# Patient Record
Sex: Male | Born: 1985 | Race: Black or African American | Hispanic: No | Marital: Married | Smoking: Never smoker
Health system: Southern US, Community
[De-identification: ages and names within clinical notes are randomized; demographics above are authoritative.]

## PROBLEM LIST (undated history)

## (undated) HISTORY — PX: WISDOM TOOTH EXTRACTION: SHX21

---

## 2003-08-02 ENCOUNTER — Emergency Department (HOSPITAL_COMMUNITY): Admission: EM | Admit: 2003-08-02 | Discharge: 2003-08-02 | Payer: Self-pay | Admitting: Emergency Medicine

## 2006-07-24 ENCOUNTER — Emergency Department (HOSPITAL_COMMUNITY): Admission: EM | Admit: 2006-07-24 | Discharge: 2006-07-24 | Payer: Self-pay | Admitting: Family Medicine

## 2006-11-20 ENCOUNTER — Emergency Department (HOSPITAL_COMMUNITY): Admission: EM | Admit: 2006-11-20 | Discharge: 2006-11-20 | Payer: Self-pay | Admitting: Emergency Medicine

## 2007-06-17 ENCOUNTER — Emergency Department (HOSPITAL_COMMUNITY): Admission: EM | Admit: 2007-06-17 | Discharge: 2007-06-17 | Payer: Self-pay | Admitting: Emergency Medicine

## 2011-12-24 ENCOUNTER — Emergency Department (HOSPITAL_BASED_OUTPATIENT_CLINIC_OR_DEPARTMENT_OTHER)
Admission: EM | Admit: 2011-12-24 | Discharge: 2011-12-24 | Disposition: A | Discharge status: Home / Self Care | Payer: Managed Care, Other (non HMO) | Attending: Emergency Medicine | Admitting: Emergency Medicine

## 2011-12-24 ENCOUNTER — Encounter (HOSPITAL_BASED_OUTPATIENT_CLINIC_OR_DEPARTMENT_OTHER): Payer: Self-pay | Admitting: *Deleted

## 2011-12-24 DIAGNOSIS — R1013 Epigastric pain: Secondary | ICD-10-CM | POA: Insufficient documentation

## 2011-12-24 MED ORDER — ONDANSETRON 8 MG PO TBDP
8.0000 mg | ORAL_TABLET | Freq: Once | ORAL | Status: AC
  Administered 2011-12-24: 8 mg via ORAL
  Filled 2011-12-24: qty 1

## 2011-12-24 MED ORDER — GI COCKTAIL ~~LOC~~
30.0000 mL | Freq: Once | ORAL | Status: AC
  Administered 2011-12-24: 30 mL via ORAL
  Filled 2011-12-24: qty 30

## 2011-12-24 MED ORDER — FAMOTIDINE 20 MG PO TABS
20.0000 mg | ORAL_TABLET | Freq: Once | ORAL | Status: AC
  Administered 2011-12-24: 20 mg via ORAL
  Filled 2011-12-24: qty 1

## 2011-12-24 NOTE — Discharge Instructions (Signed)
Take pepcid or zantac as need. You may also try maalox/myalanta as need for symptom relief. Follow up with primary care doctor, or here, in 1 days time for recheck if symptoms fail to improve/resolve. Return to ER right away if worse, severe pain, persistent vomiting, fevers, other concern.      Gastritis Gastritis is an inflammation (the body's way of reacting to injury and/or infection) of the stomach. It is often caused by viral or bacterial (germ) infections. It can also be caused by chemicals (including alcohol) and medications. This illness may be associated with generalized malaise (feeling tired, not well), cramps, and fever. The illness may last 2 to 7 days. If symptoms of gastritis continue, gastroscopy (looking into the stomach with a telescope-like instrument), biopsy (taking tissue samples), and/or blood tests may be necessary to determine the cause. Antibiotics will not affect the illness unless there is a bacterial infection present. One common bacterial cause of gastritis is an organism known as H. Pylori. This can be treated with antibiotics. Other forms of gastritis are caused by too much acid in the stomach. They can be treated with medications such as H2 blockers and antacids. Home treatment is usually all that is needed. Young children will quickly become dehydrated (loss of body fluids) if vomiting and diarrhea are both present. Medications may be given to control nausea. Medications are usually not given for diarrhea unless especially bothersome. Some medications slow the removal of the virus from the gastrointestinal tract. This slows down the healing process. HOME CARE INSTRUCTIONS Home care instructions for nausea and vomiting:  For adults: drink small amounts of fluids often. Drink at least 2 quarts a day. Take sips frequently. Do not drink large amounts of fluid at one time. This may worsen the nausea.   Only take over-the-counter or prescription medicines for pain,  discomfort, or fever as directed by your caregiver.   Drink clear liquids only. Those are anything you can see through such as water, broth, or soft drinks.   Once you are keeping clear liquids down, you may start full liquids, soups, juices, and ice cream or sherbet. Slowly add bland (plain, not spicy) foods to your diet.  Home care instructions for diarrhea:  Diarrhea can be caused by bacterial infections or a virus. Your condition should improve with time, rest, fluids, and/or anti-diarrheal medication.   Until your diarrhea is under control, you should drink clear liquids often in small amounts. Clear liquids include: water, broth, jell-o water and weak tea.  Avoid:  Milk.   Fruits.   Tobacco.   Alcohol.   Extremely hot or cold fluids.   Too much intake of anything at one time.  When your diarrhea stops you may add the following foods, which help the stool to become more formed:  Rice.   Bananas.   Apples without skin.   Dry toast.  Once these foods are tolerated you may add low-fat yogurt and low-fat cottage cheese. They will help to restore the normal bacterial balance in your bowel. Wash your hands well to avoid spreading bacteria (germ) or virus. SEEK IMMEDIATE MEDICAL CARE IF:   You are unable to keep fluids down.   Vomiting or diarrhea become persistent (constant).   Abdominal pain develops, increases, or localizes. (Right sided pain can be appendicitis. Left sided pain in adults can be diverticulitis.)   You develop a fever (an oral temperature above 102 F (38.9 C)).   Diarrhea becomes excessive or contains blood or mucus.  You have excessive weakness, dizziness, fainting or extreme thirst.   You are not improving or you are getting worse.   You have any other questions or concerns.  Document Released: 09/19/2001 Document Revised: 09/14/2011 Document Reviewed: 09/25/2005 [facility] Patient Information 2012 [place], [place].     Abdominal  Pain Abdominal pain can be caused by many things. Your caregiver decides the seriousness of your pain by an examination and possibly blood tests and X-rays. Many cases can be observed and treated at home. Most abdominal pain is not caused by a disease and will probably improve without treatment. However, in many cases, more time must pass before a clear cause of the pain can be found. Before that point, it may not be known if you need more testing, or if hospitalization or surgery is needed. HOME CARE INSTRUCTIONS   Do not take laxatives unless directed by your caregiver.   Take pain medicine only as directed by your caregiver.   Only take over-the-counter or prescription medicines for pain, discomfort, or fever as directed by your caregiver.   Try a clear liquid diet (broth, tea, or water) for as long as directed by your caregiver. Slowly move to a bland diet as tolerated.  SEEK IMMEDIATE MEDICAL CARE IF:   The pain does not go away.   You have a fever.   You keep throwing up (vomiting).   The pain is felt only in portions of the abdomen. Pain in the right side could possibly be appendicitis. In an adult, pain in the left lower portion of the abdomen could be colitis or diverticulitis.   You pass bloody or black tarry stools.  MAKE SURE YOU:   Understand these instructions.   Will watch your condition.   Will get help right away if you are not doing well or get worse.  Document Released: 07/05/2005 Document Revised: 09/14/2011 Document Reviewed: 05/13/2008 [facility] Patient Information 2012 [place], [place].

## 2011-12-24 NOTE — ED Notes (Addendum)
C/o upper abd pain "burning" since this morning- denies n/v- reports he drank etoh last night "a lot"

## 2011-12-24 NOTE — ED Provider Notes (Signed)
History     CSN: [id number]  Arrival date & time 12/24/11  1901   First MD Initiated Contact with Patient 12/24/11 2033      Chief Complaint  Patient presents with  . Abdominal Pain    (Consider location/radiation/quality/duration/timing/severity/associated sxs/prior treatment) Patient is a 26 y.o. male presenting with abdominal pain. The history is provided by the patient.  Abdominal Pain The primary symptoms of the illness include abdominal pain. The primary symptoms of the illness do not include fever, shortness of breath, vomiting or diarrhea.  Symptoms associated with the illness do not include constipation or back pain.  pt states last pm had a few drinks. This morning had burning sensation/mild pain to epigastric area. Non radiating, constant. Slowly better throughout day. Normal appetite. No nvd. No constipation. No fever or chills. No hx pud, pancreatitis or gallstones. No prior abd surgery. No cp or sob. No back or flank pain.   History reviewed. No pertinent past medical history.  History reviewed. No pertinent past surgical history.  No family history on file.  History  Substance Use Topics  . Smoking status: Never Smoker   . Smokeless tobacco: Never Used  . Alcohol Use: Yes     occasional      Review of Systems  Constitutional: Negative for fever.  HENT: Negative for neck pain.   Respiratory: Negative for cough and shortness of breath.   Cardiovascular: Negative for chest pain.  Gastrointestinal: Positive for abdominal pain. Negative for vomiting, diarrhea and constipation.  Genitourinary: Negative for flank pain.  Musculoskeletal: Negative for back pain.  Skin: Negative for rash.  Neurological: Negative for headaches.  Hematological: Does not bruise/bleed easily.  Psychiatric/Behavioral: Negative for confusion.    Allergies  Review of patient's allergies indicates no known allergies.  Home Medications  No current outpatient prescriptions on  file.  BP 152/79  Pulse 69  Temp(Src) 98 F (36.7 C) (Oral)  Resp 20  SpO2 100%  Physical Exam  Nursing note and vitals reviewed. Constitutional: He is oriented to person, place, and time. He appears well-developed and well-nourished. No distress.  HENT:  Head: Atraumatic.  Eyes: Pupils are equal, round, and reactive to light.  Neck: Neck supple. No tracheal deviation present.  Cardiovascular: Normal rate, regular rhythm, normal heart sounds and intact distal pulses.  Exam reveals no gallop and no friction rub.   No murmur heard. Pulmonary/Chest: Effort normal and breath sounds normal. No accessory muscle usage. No respiratory distress.  Abdominal: Soft. Bowel sounds are normal. He exhibits no distension and no mass. There is no tenderness. There is no rebound and no guarding.  Musculoskeletal: Normal range of motion. He exhibits no edema and no tenderness.  Neurological: He is alert and oriented to person, place, and time.  Skin: Skin is warm and dry.  Psychiatric: He has a normal mood and affect.    ED Course  Procedures (including critical care time)     MDM  Pepcid, gi cocktail, zofran.   Pt says used bathroom, had normal bms, says no pain. Normal appetite.   Pt feels improved. No pain. abd soft nt.         Suzi Roots, MD 12/24/11 2040

## 2018-04-22 ENCOUNTER — Ambulatory Visit
Admission: RE | Admit: 2018-04-22 | Discharge: 2018-04-22 | Disposition: A | Discharge status: Home / Self Care | Payer: Managed Care, Other (non HMO) | Source: Ambulatory Visit | Attending: Family Medicine | Admitting: Family Medicine

## 2018-04-22 ENCOUNTER — Other Ambulatory Visit: Payer: Self-pay | Admitting: Family Medicine

## 2018-04-22 DIAGNOSIS — M25511 Pain in right shoulder: Secondary | ICD-10-CM

## 2019-02-17 DIAGNOSIS — I10 Essential (primary) hypertension: Secondary | ICD-10-CM | POA: Diagnosis not present

## 2019-02-17 DIAGNOSIS — E7800 Pure hypercholesterolemia, unspecified: Secondary | ICD-10-CM | POA: Diagnosis not present

## 2019-02-17 DIAGNOSIS — J309 Allergic rhinitis, unspecified: Secondary | ICD-10-CM | POA: Diagnosis not present

## 2019-08-15 ENCOUNTER — Encounter (HOSPITAL_BASED_OUTPATIENT_CLINIC_OR_DEPARTMENT_OTHER): Payer: Self-pay

## 2019-08-15 ENCOUNTER — Other Ambulatory Visit: Payer: Self-pay

## 2019-08-15 ENCOUNTER — Emergency Department (HOSPITAL_BASED_OUTPATIENT_CLINIC_OR_DEPARTMENT_OTHER)
Admission: EM | Admit: 2019-08-15 | Discharge: 2019-08-15 | Disposition: A | Discharge status: Home / Self Care | Payer: 59 | Attending: Emergency Medicine | Admitting: Emergency Medicine

## 2019-08-15 DIAGNOSIS — J029 Acute pharyngitis, unspecified: Secondary | ICD-10-CM | POA: Insufficient documentation

## 2019-08-15 DIAGNOSIS — Z20822 Contact with and (suspected) exposure to covid-19: Secondary | ICD-10-CM

## 2019-08-15 DIAGNOSIS — R05 Cough: Secondary | ICD-10-CM | POA: Diagnosis not present

## 2019-08-15 DIAGNOSIS — Z20828 Contact with and (suspected) exposure to other viral communicable diseases: Secondary | ICD-10-CM

## 2019-08-15 DIAGNOSIS — R439 Unspecified disturbances of smell and taste: Secondary | ICD-10-CM | POA: Diagnosis present

## 2019-08-15 MED ORDER — NAPROXEN 500 MG PO TABS: 500.0000 mg | ORAL_TABLET | Freq: Two times a day (BID) | ORAL | 0 refills | Status: AC

## 2019-08-15 MED ORDER — BENZONATATE 100 MG PO CAPS: 100.0000 mg | ORAL_CAPSULE | Freq: Three times a day (TID) | ORAL | 0 refills | Status: AC

## 2019-08-15 NOTE — ED Provider Notes (Signed)
Benjamin Swanson   CSN: [id number] Arrival date & time: 08/15/19  2036     History   Chief Complaint Chief Complaint  Patient presents with  . Cough    HPI Benjamin Swanson is a 33 y.o. male with a history of HTN & hyperlipidemia who presents to the ED requesting COVID 19 testing. Patient states over the past 24 hours he has developed loss of taste/smell, sore throat, & dry cough. No alleviating/aggravating factors. No intervention PTA. No known covid exposures, but does work with the public some. Denies fever, chills, ear pain, nasal congestion, fever, chest pain, or dyspnea.      HPI  History reviewed. No pertinent past medical history.  There are no active problems to display for this patient.   History reviewed. No pertinent surgical history.      Home Medications    Prior to Admission medications   Medication Sig Start Date End Date Taking? Authorizing Provider  fexofenadine-pseudoephedrine (ALLEGRA-D 24) 180-240 MG per 24 hr tablet Take 1 tablet by mouth daily. Patient took this medication for allergies.    [provider]    Family History No family history on file.  Social History Social History   Tobacco Use  . Smoking status: Never Smoker  . Smokeless tobacco: Never Used  Substance Use Topics  . Alcohol use: Not Currently  . Drug use: No     Allergies   Patient has no known allergies.   Review of Systems Review of Systems  Constitutional: Negative for chills and fever.  HENT: Positive for sore throat. Negative for congestion, ear pain and rhinorrhea.        + for change in smell/taste  Respiratory: Positive for cough. Negative for shortness of breath.   Cardiovascular: Negative for chest pain and leg swelling.  Gastrointestinal: Negative for abdominal pain, diarrhea, nausea and vomiting.  Musculoskeletal: Negative for myalgias.   Physical Exam Updated Vital Signs BP (!) 149/97 (BP Location:  Left Arm)   Pulse 72   Temp 98.7 F (37.1 C) (Oral)   Resp 20   Ht 5\' 9"  (1.753 m)   Wt 88.5 kg   SpO2 97%   BMI 28.80 kg/m   Physical Exam Vitals signs and nursing Swanson reviewed.  Constitutional:      General: He is not in acute distress.    Appearance: He is well-developed. He is not toxic-appearing.  HENT:     Head: Normocephalic and atraumatic.     Right Ear: Ear canal and external ear normal. Tympanic membrane is not perforated, erythematous, retracted or bulging.     Left Ear: Ear canal and external ear normal. Tympanic membrane is not perforated, erythematous, retracted or bulging.     Nose: Nose normal.     Right Sinus: No maxillary sinus tenderness or frontal sinus tenderness.     Left Sinus: No maxillary sinus tenderness or frontal sinus tenderness.     Mouth/Throat:     Mouth: Mucous membranes are moist.     Pharynx: Oropharynx is clear. Uvula midline. No oropharyngeal exudate or posterior oropharyngeal erythema.     Tonsils: No tonsillar exudate.     Comments: Posterior oropharynx is symmetric appearing. Patient tolerating own secretions without difficulty. No trismus. No drooling. No hot potato voice. No swelling beneath the tongue, submandibular compartment is soft.  Eyes:     General:        Right eye: No discharge.  Left eye: No discharge.     Conjunctiva/sclera: Conjunctivae normal.  Neck:     Musculoskeletal: Neck supple. No neck rigidity or crepitus.  Cardiovascular:     Rate and Rhythm: Normal rate and regular rhythm.  Pulmonary:     Effort: Pulmonary effort is normal. No tachypnea, accessory muscle usage or respiratory distress.     Breath sounds: Normal breath sounds. No wheezing, rhonchi or rales.  Abdominal:     General: There is no distension.     Palpations: Abdomen is soft.     Tenderness: There is no abdominal tenderness.  Lymphadenopathy:     Cervical: No cervical adenopathy.  Skin:    General: Skin is warm and dry.     Findings: No  rash.  Neurological:     Mental Status: He is alert.     Comments: Clear speech.   Psychiatric:        Behavior: Behavior normal.    ED Treatments / Results  Labs (all labs ordered are listed, but only abnormal results are displayed) Labs Reviewed - No data to display  EKG None  Radiology No results found.  Procedures Procedures (including critical care time)  Medications Ordered in ED Medications - No data to display   Initial Impression / Assessment and Plan / ED Course  I have reviewed the triage vital signs and the nursing notes.  Pertinent labs & imaging results that were available during my care of the patient were reviewed by me and considered in my medical decision making (see chart for details).   Patient presents to the ED w/ complaints of loss of smell/taste, sore throat, & dry cough requesting COVID 19 testing.  Patient is nontoxic-appearing, in no apparent distress, vitals WNL with the exception of elevated BP- doubt HTN emergency. Afebrile, no sinus tenderness, sxs < 10 days, doubt acute bacterial sinusitis. Centor 0- doubt strep. No signs of RPA/PTA. No meningismus. Lungs CTA, afebrile, doubt CAP. Likely viral. Will test for COVID. Discussed quarantine/hang hygiene. Will given tessalon & naproxen. I discussed treatment plan, need for follow-up, and return precautions with the patient. Provided opportunity for questions, patient confirmed understanding and is in agreement with plan.   Benjamin Swanson was evaluated in Emergency Department on 08/15/2019 for the symptoms described in the history of present illness. He/she was evaluated in the context of the global COVID-19 pandemic, which necessitated consideration that the patient might be at risk for infection with the SARS-CoV-2 virus that causes COVID-19. Institutional protocols and algorithms that pertain to the evaluation of patients at risk for COVID-19 are in a state of rapid change based on information released by  regulatory bodies including the CDC and federal and state organizations. These policies and algorithms were followed during the patient's care in the ED.  Final Clinical Impressions(s) / ED Diagnoses   Final diagnoses:  Encounter for laboratory testing for COVID-19 virus    ED Discharge Orders         Ordered    naproxen (NAPROSYN) 500 MG tablet  2 times daily     08/15/19 2136    benzonatate (TESSALON) 100 MG capsule  Every 8 hours     08/15/19 2136           Cherly Anderson, PA-C 08/15/19 2138    Vanetta Mulders, MD 08/19/19 1816

## 2019-08-15 NOTE — Discharge Instructions (Addendum)
You were seen in the emergency department today for sore throat, cough, and loss of taste/smell. We are sending you home with Tessalon to take every 8 hours as needed for coughing and naproxen to take every 12 hours as needed for pain.  Please be sure to take naproxen with food as it can cause stomach upset and it or stomach bleeding.  Do not take other NSAIDs with this medicine such as Motrin, Advil, Aleve, Goody powder, ibuprofen, etc.  We have prescribed you new medication(s) today. Discuss the medications prescribed today with your pharmacist as they can have adverse effects and interactions with your other medicines including over the counter and prescribed medications. Seek medical evaluation if you start to experience new or abnormal symptoms after taking one of these medicines, seek care immediately if you start to experience difficulty breathing, feeling of your throat closing, facial swelling, or rash as these could be indications of a more serious allergic reaction  We have tested you for COVID 19- we will call you if results are positive, you can also see this on MyChart. We are instructing those under investigation until they have results or those with positive results to quarantine themselves for 14 days. You may be able to discontinue self quarantine if the following conditions are met:   Persons with COVID-19 who have symptoms and were directed to care for themselves at home may discontinue home isolation under the  following conditions: - It has been at least 7 days have passed since symptoms first appeared. - AND at least 3 days (72 hours) have passed since recovery defined as resolution of fever without the use of fever-reducing medications and improvement in respiratory symptoms (e.g., cough, shortness of breath)  Please follow the below quarantine instructions.   Please follow up with primary care within 3-5 days for re-evaluation- call prior to going to the office to make them  aware of your symptoms. Return to the ER for new or worsening symptoms including but not limited to increased work of breathing, fever, chest pain, passing out, or any other concerns.       Person Under Monitoring Name: Benjamin Swanson  Location: [place] [address]   Infection Prevention Recommendations for Individuals Confirmed to have, or Being Evaluated for, 2019 Novel Coronavirus (COVID-19) Infection Who Receive Care at Home  Individuals who are confirmed to have, or are being evaluated for, COVID-19 should follow the prevention steps below until a healthcare provider or local or state health department says they can return to normal activities.  Stay home except to get medical care You should restrict activities outside your home, except for getting medical care. Do not go to work, school, or public areas, and do not use public transportation or taxis.  Call ahead before visiting your doctor Before your medical appointment, call the healthcare provider and tell them that you have, or are being evaluated for, COVID-19 infection. This will help the healthcare providers office take steps to keep other people from getting infected. Ask your healthcare provider to call the local or state health department.  Monitor your symptoms Seek prompt medical attention if your illness is worsening (e.g., difficulty breathing). Before going to your medical appointment, call the healthcare provider and tell them that you have, or are being evaluated for, COVID-19 infection. Ask your healthcare provider to call the local or state health department.  Wear a facemask You should wear a facemask that covers your nose and mouth when you  are in the same room with other people and when you visit a healthcare provider. People who live with or visit you should also wear a facemask while they are in the same room with you.  Separate yourself from other people in your home As much  as possible, you should stay in a different room from other people in your home. Also, you should use a separate bathroom, if available.  Avoid sharing household items You should not share dishes, drinking glasses, cups, eating utensils, towels, bedding, or other items with other people in your home. After using these items, you should wash them thoroughly with soap and water.  Cover your coughs and sneezes Cover your mouth and nose with a tissue when you cough or sneeze, or you can cough or sneeze into your sleeve. Throw used tissues in a lined trash can, and immediately wash your hands with soap and water for at least 20 seconds or use an alcohol-based hand rub.  Wash your [facility] your hands often and thoroughly with soap and water for at least 20 seconds. You can use an alcohol-based hand sanitizer if soap and water are not available and if your hands are not visibly dirty. Avoid touching your eyes, nose, and mouth with unwashed hands.   Prevention Steps for Caregivers and Household Members of Individuals Confirmed to have, or Being Evaluated for, COVID-19 Infection Being Cared for in the Home  If you live with, or provide care at home for, a person confirmed to have, or being evaluated for, COVID-19 infection please follow these guidelines to prevent infection:  Follow healthcare providers instructions Make sure that you understand and can help the patient follow any healthcare provider instructions for all care.  Provide for the patients basic needs You should help the patient with basic needs in the home and provide support for getting groceries, prescriptions, and other personal needs.  Monitor the patients symptoms If they are getting sicker, call his or her medical provider and tell them that the patient has, or is being evaluated for, COVID-19 infection. This will help the healthcare providers office take steps to keep other people from getting infected. Ask the  healthcare provider to call the local or state health department.  Limit the number of people who have contact with the patient If possible, have only one caregiver for the patient. Other household members should stay in another home or place of residence. If this is not possible, they should stay in another room, or be separated from the patient as much as possible. Use a separate bathroom, if available. Restrict visitors who do not have an essential need to be in the home.  Keep older adults, very young children, and other sick people away from the patient Keep older adults, very young children, and those who have compromised immune systems or chronic health conditions away from the patient. This includes people with chronic heart, lung, or kidney conditions, diabetes, and cancer.  Ensure good ventilation Make sure that shared spaces in the home have good air flow, such as from an air conditioner or an opened window, weather permitting.  Wash your hands often Wash your hands often and thoroughly with soap and water for at least 20 seconds. You can use an alcohol based hand sanitizer if soap and water are not available and if your hands are not visibly dirty. Avoid touching your eyes, nose, and mouth with unwashed hands. Use disposable paper towels to dry your hands. If not available, use  dedicated cloth towels and replace them when they become wet.  Wear a facemask and gloves Wear a disposable facemask at all times in the room and gloves when you touch or have contact with the patients blood, body fluids, and/or secretions or excretions, such as sweat, saliva, sputum, nasal mucus, vomit, urine, or feces.  Ensure the mask fits over your nose and mouth tightly, and do not touch it during use. Throw out disposable facemasks and gloves after using them. Do not reuse. Wash your hands immediately after removing your facemask and gloves. If your personal clothing becomes contaminated, carefully  remove clothing and launder. Wash your hands after handling contaminated clothing. Place all used disposable facemasks, gloves, and other waste in a lined container before disposing them with other household waste. Remove gloves and wash your hands immediately after handling these items.  Do not share dishes, glasses, or other household items with the patient Avoid sharing household items. You should not share dishes, drinking glasses, cups, eating utensils, towels, bedding, or other items with a patient who is confirmed to have, or being evaluated for, COVID-19 infection. After the person uses these items, you should wash them thoroughly with soap and water.  Wash laundry thoroughly Immediately remove and wash clothes or bedding that have blood, body fluids, and/or secretions or excretions, such as sweat, saliva, sputum, nasal mucus, vomit, urine, or feces, on them. Wear gloves when handling laundry from the patient. Read and follow directions on labels of laundry or clothing items and detergent. In general, wash and dry with the warmest temperatures recommended on the label.  Clean all areas the individual has used often Clean all touchable surfaces, such as counters, tabletops, doorknobs, bathroom fixtures, toilets, phones, keyboards, tablets, and bedside tables, every day. Also, clean any surfaces that may have blood, body fluids, and/or secretions or excretions on them. Wear gloves when cleaning surfaces the patient has come in contact with. Use a diluted bleach solution (e.g., dilute bleach with 1 part bleach and 10 parts water) or a household disinfectant with a label that says EPA-registered for coronaviruses. To make a bleach solution at home, add 1 tablespoon of bleach to 1 quart (4 cups) of water. For a larger supply, add  cup of bleach to 1 gallon (16 cups) of water. Read labels of cleaning products and follow recommendations provided on product labels. Labels contain instructions for  safe and effective use of the cleaning product including precautions you should take when applying the product, such as wearing gloves or eye protection and making sure you have good ventilation during use of the product. Remove gloves and wash hands immediately after cleaning.  Monitor yourself for signs and symptoms of illness Caregivers and household members are considered close contacts, should monitor their health, and will be asked to limit movement outside of the home to the extent possible. Follow the monitoring steps for close contacts listed on the symptom monitoring form.   ? If you have additional questions, contact your local health department or call the epidemiologist on call at [phone] (available 24/7). ? This guidance is subject to change. For the most up-to-date guidance from [facility], please refer to their website: [email or web address]

## 2019-08-15 NOTE — ED Triage Notes (Signed)
C/o flu like sx including loss of taste-NAD-steady gait

## 2019-08-17 LAB — SARS CORONAVIRUS 2 (TAT 6-24 HRS): SARS Coronavirus 2: NEGATIVE

## 2019-09-30 ENCOUNTER — Ambulatory Visit: Payer: Managed Care, Other (non HMO) | Attending: Internal Medicine

## 2019-09-30 DIAGNOSIS — Z20822 Contact with and (suspected) exposure to covid-19: Secondary | ICD-10-CM

## 2019-10-02 LAB — NOVEL CORONAVIRUS, NAA: SARS-CoV-2, NAA: NOT DETECTED

## 2020-05-09 ENCOUNTER — Telehealth (HOSPITAL_COMMUNITY): Payer: Self-pay | Admitting: Nurse Practitioner

## 2020-05-09 DIAGNOSIS — U071 COVID-19: Secondary | ICD-10-CM | POA: Insufficient documentation

## 2020-05-09 NOTE — Telephone Encounter (Signed)
Called to Discuss with patient about Covid symptoms and the use of regeneron, a monoclonal antibody infusion for those with mild to moderate Covid symptoms and at a high risk of hospitalization.     Pt is qualified for this infusion at the [facility] infusion center due to co-morbid conditions and/or a member of an at-risk group.     Unable to reach pt. Sent [employer].   Consuello Masse, DNP, AGNP-C [phone] (Infusion Center Hotline)

## 2020-05-18 ENCOUNTER — Ambulatory Visit: Payer: Managed Care, Other (non HMO) | Attending: Internal Medicine

## 2020-05-18 DIAGNOSIS — Z23 Encounter for immunization: Secondary | ICD-10-CM

## 2020-05-18 NOTE — Progress Notes (Signed)
   Covid-19 Vaccination Clinic  Name:  Benjamin Swanson    MRN: [id number] DOB: 1986/01/02  05/18/2020  [name] was observed post Covid-19 immunization for 15 minutes without incident. He was provided with Vaccine Information Sheet and instruction to access the V-Safe system.   [name] was instructed to call 911 with any severe reactions post vaccine: Marland Kitchen Difficulty breathing  . Swelling of face and throat  . A fast heartbeat  . A bad rash all over body  . Dizziness and weakness   Immunizations Administered    Name Date Dose VIS Date Route   Pfizer COVID-19 Vaccine 05/18/2020  1:01 PM 0.3 mL 12/03/2018 Intramuscular   Manufacturer: [employer], [employer]   Lot: [id number]   NDC: [address]-3

## 2020-06-08 ENCOUNTER — Ambulatory Visit: Payer: Managed Care, Other (non HMO)

## 2020-06-22 ENCOUNTER — Ambulatory Visit: Payer: Managed Care, Other (non HMO) | Attending: Internal Medicine

## 2020-06-22 DIAGNOSIS — Z23 Encounter for immunization: Secondary | ICD-10-CM

## 2020-06-22 NOTE — Progress Notes (Signed)
   Covid-19 Vaccination Clinic  Name:  Benjamin Swanson    MRN: [id number] DOB: [birth year]  06/22/2020  [name] was observed post Covid-19 immunization for 15 minutes without incident. He was provided with Vaccine Information Sheet and instruction to access the V-Safe system.   [name] was instructed to call 911 with any severe reactions post vaccine: Marland Kitchen Difficulty breathing  . Swelling of face and throat  . A fast heartbeat  . A bad rash all over body  . Dizziness and weakness   Immunizations Administered    Name Date Dose VIS Date Route   Pfizer COVID-19 Vaccine 06/22/2020  2:06 PM 0.3 mL 12/03/2018 Intramuscular   Manufacturer: [employer], [employer]   Lot: [id number]   NDC: [id number]

## 2021-03-20 ENCOUNTER — Emergency Department (HOSPITAL_BASED_OUTPATIENT_CLINIC_OR_DEPARTMENT_OTHER)
Admission: EM | Admit: 2021-03-20 | Discharge: 2021-03-21 | Disposition: A | Discharge status: Home / Self Care | Payer: 59 | Attending: Emergency Medicine | Admitting: Emergency Medicine

## 2021-03-20 ENCOUNTER — Encounter (HOSPITAL_BASED_OUTPATIENT_CLINIC_OR_DEPARTMENT_OTHER): Payer: Self-pay | Admitting: *Deleted

## 2021-03-20 ENCOUNTER — Emergency Department (HOSPITAL_COMMUNITY)
Admission: EM | Admit: 2021-03-20 | Discharge: 2021-03-20 | Disposition: A | Discharge status: Home / Self Care | Payer: 59 | Attending: Emergency Medicine | Admitting: Emergency Medicine

## 2021-03-20 ENCOUNTER — Other Ambulatory Visit: Payer: Self-pay

## 2021-03-20 ENCOUNTER — Emergency Department (HOSPITAL_BASED_OUTPATIENT_CLINIC_OR_DEPARTMENT_OTHER): Payer: 59

## 2021-03-20 ENCOUNTER — Encounter (HOSPITAL_COMMUNITY): Payer: Self-pay | Admitting: *Deleted

## 2021-03-20 DIAGNOSIS — S61213A Laceration without foreign body of left middle finger without damage to nail, initial encounter: Secondary | ICD-10-CM | POA: Diagnosis present

## 2021-03-20 DIAGNOSIS — W268XXA Contact with other sharp object(s), not elsewhere classified, initial encounter: Secondary | ICD-10-CM | POA: Insufficient documentation

## 2021-03-20 DIAGNOSIS — Y93H2 Activity, gardening and landscaping: Secondary | ICD-10-CM | POA: Diagnosis not present

## 2021-03-20 DIAGNOSIS — S6992XA Unspecified injury of left wrist, hand and finger(s), initial encounter: Secondary | ICD-10-CM | POA: Diagnosis present

## 2021-03-20 DIAGNOSIS — I10 Essential (primary) hypertension: Secondary | ICD-10-CM | POA: Diagnosis not present

## 2021-03-20 DIAGNOSIS — Z5321 Procedure and treatment not carried out due to patient leaving prior to being seen by health care provider: Secondary | ICD-10-CM | POA: Diagnosis not present

## 2021-03-20 DIAGNOSIS — W28XXXA Contact with powered lawn mower, initial encounter: Secondary | ICD-10-CM | POA: Diagnosis not present

## 2021-03-20 DIAGNOSIS — Z23 Encounter for immunization: Secondary | ICD-10-CM | POA: Diagnosis not present

## 2021-03-20 MED ORDER — TETANUS-DIPHTH-ACELL PERTUSSIS 5-2.5-18.5 LF-MCG/0.5 IM SUSY
0.5000 mL | PREFILLED_SYRINGE | Freq: Once | INTRAMUSCULAR | Status: AC
  Administered 2021-03-20: 0.5 mL via INTRAMUSCULAR
  Filled 2021-03-20: qty 0.5

## 2021-03-20 MED ORDER — LIDOCAINE HCL (PF) 1 % IJ SOLN
10.0000 mL | Freq: Once | INTRAMUSCULAR | Status: AC
  Administered 2021-03-20: 10 mL
  Filled 2021-03-20: qty 10

## 2021-03-20 NOTE — ED Provider Notes (Signed)
[facility] Emergency Department Provider Note MRN:  [id number]  Arrival date & time: 03/21/21     Chief Complaint   Laceration   History of Present Illness   Benjamin Swanson is a 35 y.o. year-old male with a history of hypertension presenting to the ED with chief complaint of laceration.  Patient was sharpening his lawnmower blade when the blade over his hand slipped and he sustained a laceration to the tip of the left middle finger.  Denies any other injuries.  Did not fall or hit his head.  Pain is mild, constant, worse with motion or palpation of the area.  Review of Systems  A problem-focused ROS was performed. Positive for finger pain.  Patient denies head trauma.  Patient's Health History    Past Medical History:  Diagnosis Date   High cholesterol    Hypertension     Past Surgical History:  Procedure Laterality Date   WISDOM TOOTH EXTRACTION      No family history on file.  Social History   Socioeconomic History   Marital status: Married    Spouse name: Not on file   Number of children: Not on file   Years of education: Not on file   Highest education level: Not on file  Occupational History   Not on file  Tobacco Use   Smoking status: Never   Smokeless tobacco: Never  Vaping Use   Vaping Use: Never used  Substance and Sexual Activity   Alcohol use: Not Currently    Comment: weekends   Drug use: No   Sexual activity: Not on file  Other Topics Concern   Not on file  Social History Narrative   Not on file   Social Determinants of Health   Financial Resource Strain: Not on file  Food Insecurity: Not on file  Transportation Needs: Not on file  Physical Activity: Not on file  Stress: Not on file  Social Connections: Not on file  Intimate Partner Violence: Not on file     Physical Exam   Vitals:   03/21/21 0000 03/21/21 0022  BP: (!) 130/98 (!) 130/98  Pulse:  88  Resp:  18  Temp:    SpO2:  100%     CONSTITUTIONAL: Well-appearing, NAD NEURO:  Alert and oriented x 3, no focal deficits EYES:  eyes equal and reactive ENT/NECK:  no LAD, no JVD CARDIO: Regular rate, well-perfused, normal S1 and S2 PULM:  CTAB no wheezing or rhonchi GI/GU:  normal bowel sounds, non-distended, non-tender MSK/SPINE:  No gross deformities, no edema SKIN: Deep laceration to the ulnar aspect of the distal left middle finger, no nailbed involvement, neurovascularly intact distally PSYCH:  Appropriate speech and behavior  *Additional and/or pertinent findings included in MDM below  Diagnostic and Interventional Summary    EKG Interpretation  Date/Time:    Ventricular Rate:    PR Interval:    QRS Duration:   QT Interval:    QTC Calculation:   R Axis:     Text Interpretation:          Labs Reviewed - No data to display  DG Finger Middle Left  Final Result      Medications  Tdap (BOOSTRIX) injection 0.5 mL (0.5 mLs Intramuscular Given 03/20/21 2329)  lidocaine (PF) (XYLOCAINE) 1 % injection 10 mL (10 mLs Infiltration Given 03/20/21 2328)     Procedures  /  Critical Care .Marland KitchenLaceration Repair  Date/Time: 03/21/2021 12:43 AM Performed by: Kennis Carina  M, MD Authorized by: Sabas Sous, MD   Consent:    Consent obtained:  Verbal   Consent given by:  Patient   Risks, benefits, and alternatives were discussed: yes     Risks discussed:  Infection, need for additional repair, nerve damage, pain, poor cosmetic result, poor wound healing, vascular damage, tendon damage and retained foreign body Universal protocol:    Procedure explained and questions answered to patient or proxy's satisfaction: yes     Immediately prior to procedure, a time out was called: yes     Patient identity confirmed:  Verbally with patient Anesthesia:    Anesthesia method:  Local infiltration and nerve block   Local anesthetic:  Lidocaine 1% w/o epi   Block location:  Digital block   Block needle gauge:  25 G   Block  anesthetic:  Lidocaine 1% w/o epi   Block technique:  Webspace injection   Block injection procedure:  Anatomic landmarks identified and anatomic landmarks palpated   Block outcome:  Incomplete block Laceration details:    Location:  Finger   Finger location:  L long finger   Length (cm):  3   Depth (mm):  2 Pre-procedure details:    Preparation:  Patient was prepped and draped in usual sterile fashion and imaging obtained to evaluate for foreign bodies Exploration:    Limited defect created (wound extended): no     Hemostasis achieved with:  Direct pressure   Imaging obtained: x-ray     Imaging outcome: foreign body not noted     Wound exploration: wound explored through full range of motion and entire depth of wound visualized     Contaminated: no   Treatment:    Area cleansed with:  Saline   Amount of cleaning:  Extensive   Debridement:  None   Undermining:  None Skin repair:    Repair method:  Sutures   Suture size:  4-0   Suture material:  Prolene   Suture technique:  Simple interrupted   Number of sutures:  5 Approximation:    Approximation:  Close Repair type:    Repair type:  Simple Post-procedure details:    Dressing:  Antibiotic ointment and bulky dressing   Procedure completion:  Tolerated well, no immediate complications  ED Course and Medical Decision Making  I have reviewed the triage vital signs, the nursing notes, and pertinent available records from the EMR.  Listed above are laboratory and imaging tests that I personally ordered, reviewed, and interpreted and then considered in my medical decision making (see below for details).  Laceration, will need digital block and repair.  Will update tetanus, x-ray pending to exclude open fracture or foreign body.     X-ray normal, lack repair as described above, appropriate for discharge.  Elmer Sow. Pilar Plate, MD [facility] Emergency Medicine [facility] [email or web address]  Final Clinical  Impressions(s) / ED Diagnoses     ICD-10-CM   1. Laceration of left middle finger without foreign body without damage to nail, initial encounter  C58.527P       ED Discharge Orders     None        Discharge Instructions Discussed with and Provided to Patient:     Discharge Instructions      You were evaluated in the Emergency Department and after careful evaluation, we did not find any emergent condition requiring admission or further testing in the hospital.  Your exam/testing today was overall reassuring.  We repaired your  laceration here in the emergency department with stitches.  These will need to be removed by healthcare professional in 7-10 days.  Please return to the Emergency Department if you experience any worsening of your condition.  Thank you for allowing [place] to be a part of your care.         Sabas Sous, MD 03/21/21 [phone]

## 2021-03-20 NOTE — ED Triage Notes (Signed)
The pt was sharpening a lawn mower blade not running the blade cut his  lt middle finger 30 miutes ago,  the finger has continued to bleed  a presure dressing was placed

## 2021-03-20 NOTE — ED Triage Notes (Addendum)
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 He was at [facility] earlier but left due to the wait

## 2021-03-21 NOTE — Discharge Instructions (Addendum)
You were evaluated in the Emergency Department and after careful evaluation, we did not find any emergent condition requiring admission or further testing in the hospital.  Your exam/testing today was overall reassuring.  We repaired your laceration here in the emergency department with stitches.  These will need to be removed by healthcare professional in 7-10 days.  Please return to the Emergency Department if you experience any worsening of your condition.  Thank you for allowing [place] to be a part of your care.

## 2021-10-23 IMAGING — DX DG FINGER MIDDLE 2+V*L*
3 series · 3 of 3 positions shown · non-contrast
Comparison: None.

CLINICAL DATA: Cut finger on lawnmower blade, concern for open
fracture

EXAM:
LEFT MIDDLE FINGER 2+V

[finger ap]
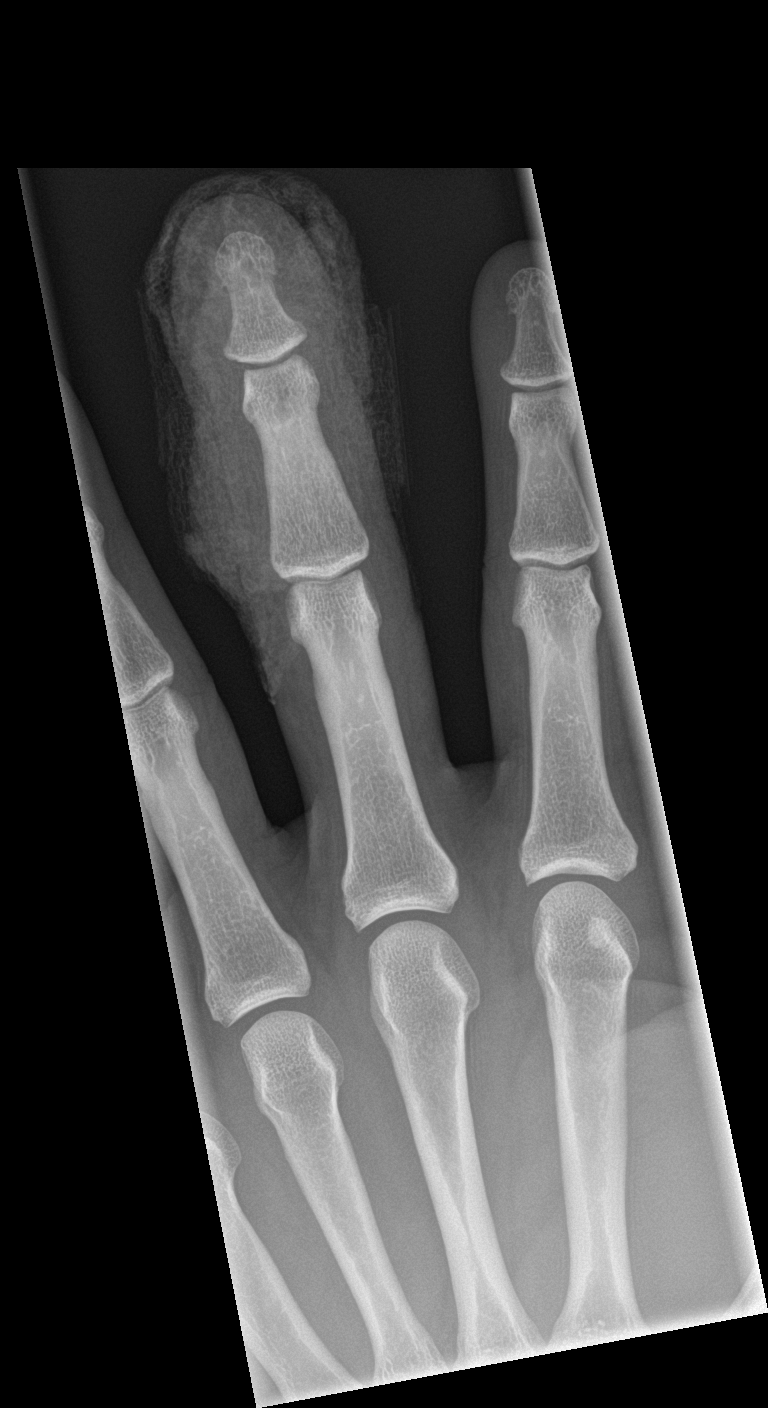

[finger obl]
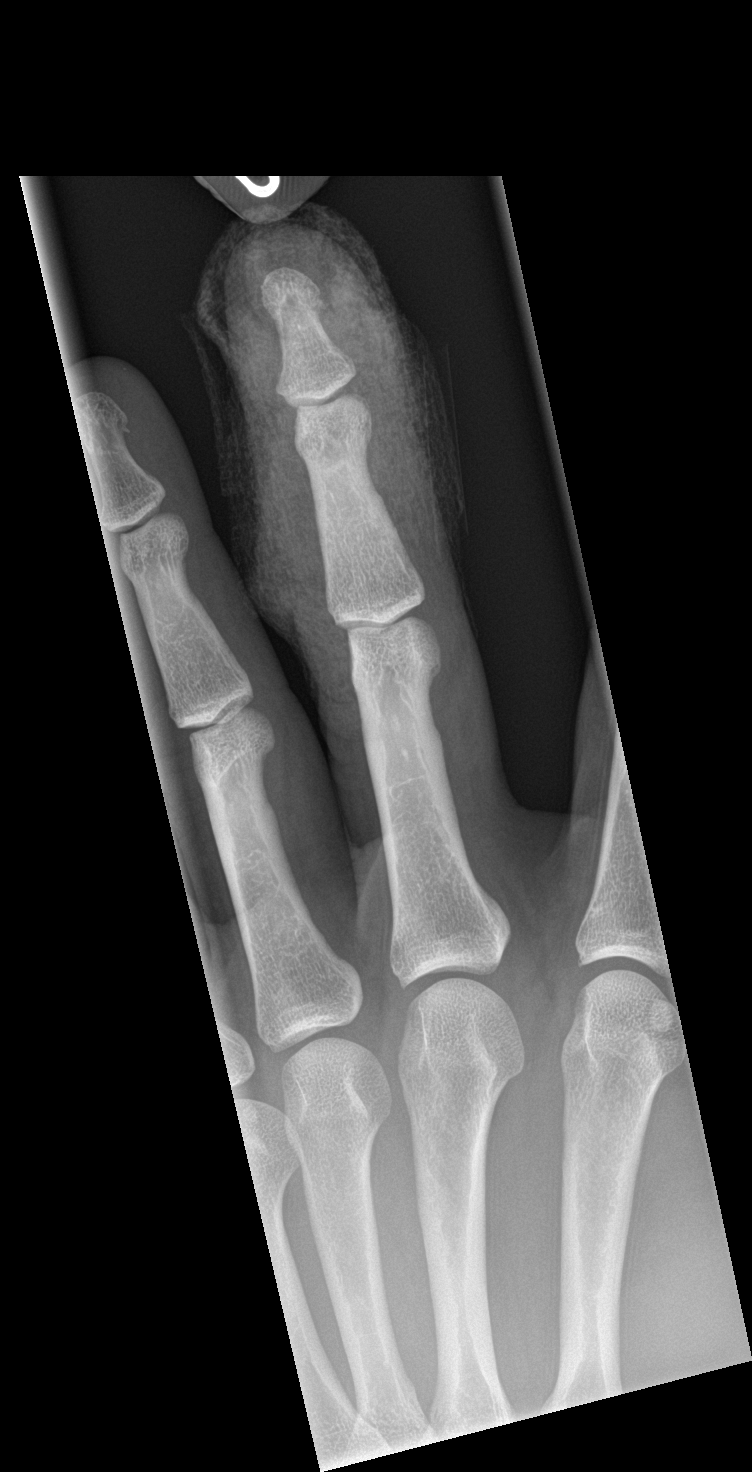

[finger lat]
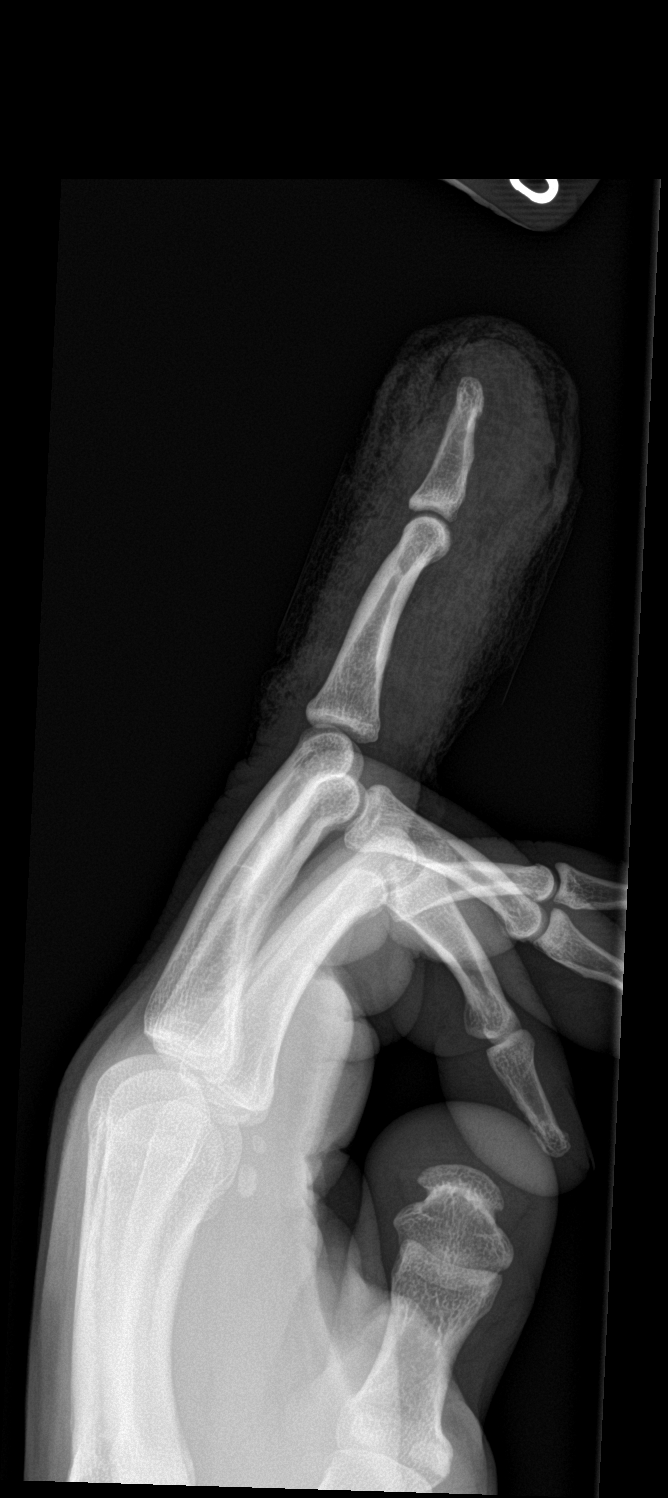

[3 of 3 positions shown; findings below may reference images not displayed]

FINDINGS: Soft tissue irregularity/laceration involving the tip of the third
digit with overlying bandaging material in place. No discernible
fracture or traumatic malalignment is seen. No other acute or
suspicious osseous abnormalities. Normal bone mineralization. No
radiodense foreign bodies.
IMPRESSION: Soft tissue irregularity/laceration involving the tip of the third
digit without visible subjacent fracture or foreign body.

## 2024-01-15 ENCOUNTER — Other Ambulatory Visit: Payer: Self-pay | Admitting: Internal Medicine

## 2024-01-15 DIAGNOSIS — E010 Iodine-deficiency related diffuse (endemic) goiter: Secondary | ICD-10-CM

## 2024-01-21 ENCOUNTER — Ambulatory Visit
Admission: RE | Admit: 2024-01-21 | Discharge: 2024-01-21 | Disposition: A | Discharge status: Home / Self Care | Source: Ambulatory Visit | Attending: Internal Medicine | Admitting: Internal Medicine

## 2024-01-21 DIAGNOSIS — E010 Iodine-deficiency related diffuse (endemic) goiter: Secondary | ICD-10-CM
